# Patient Record
Sex: Female | Born: 1935 | Race: Black or African American | Hispanic: No | Marital: Single
Health system: Southern US, Community
[De-identification: ages and names within clinical notes are randomized; demographics above are authoritative.]

---

## 2008-06-17 ENCOUNTER — Ambulatory Visit: Payer: Self-pay | Admitting: Family Medicine

## 2008-06-27 ENCOUNTER — Ambulatory Visit: Payer: Self-pay | Admitting: Internal Medicine

## 2008-08-08 ENCOUNTER — Ambulatory Visit: Payer: Self-pay | Admitting: Internal Medicine

## 2008-08-27 ENCOUNTER — Ambulatory Visit: Payer: Self-pay | Admitting: Family Medicine

## 2008-11-12 ENCOUNTER — Ambulatory Visit: Payer: Self-pay

## 2009-07-09 IMAGING — CR DG ABDOMEN 1V
1 series · 1 of 1 positions shown · non-contrast
Comparison: none

REASON FOR EXAM: FECAL IMPACTION
COMMENTS:

[view not recorded]
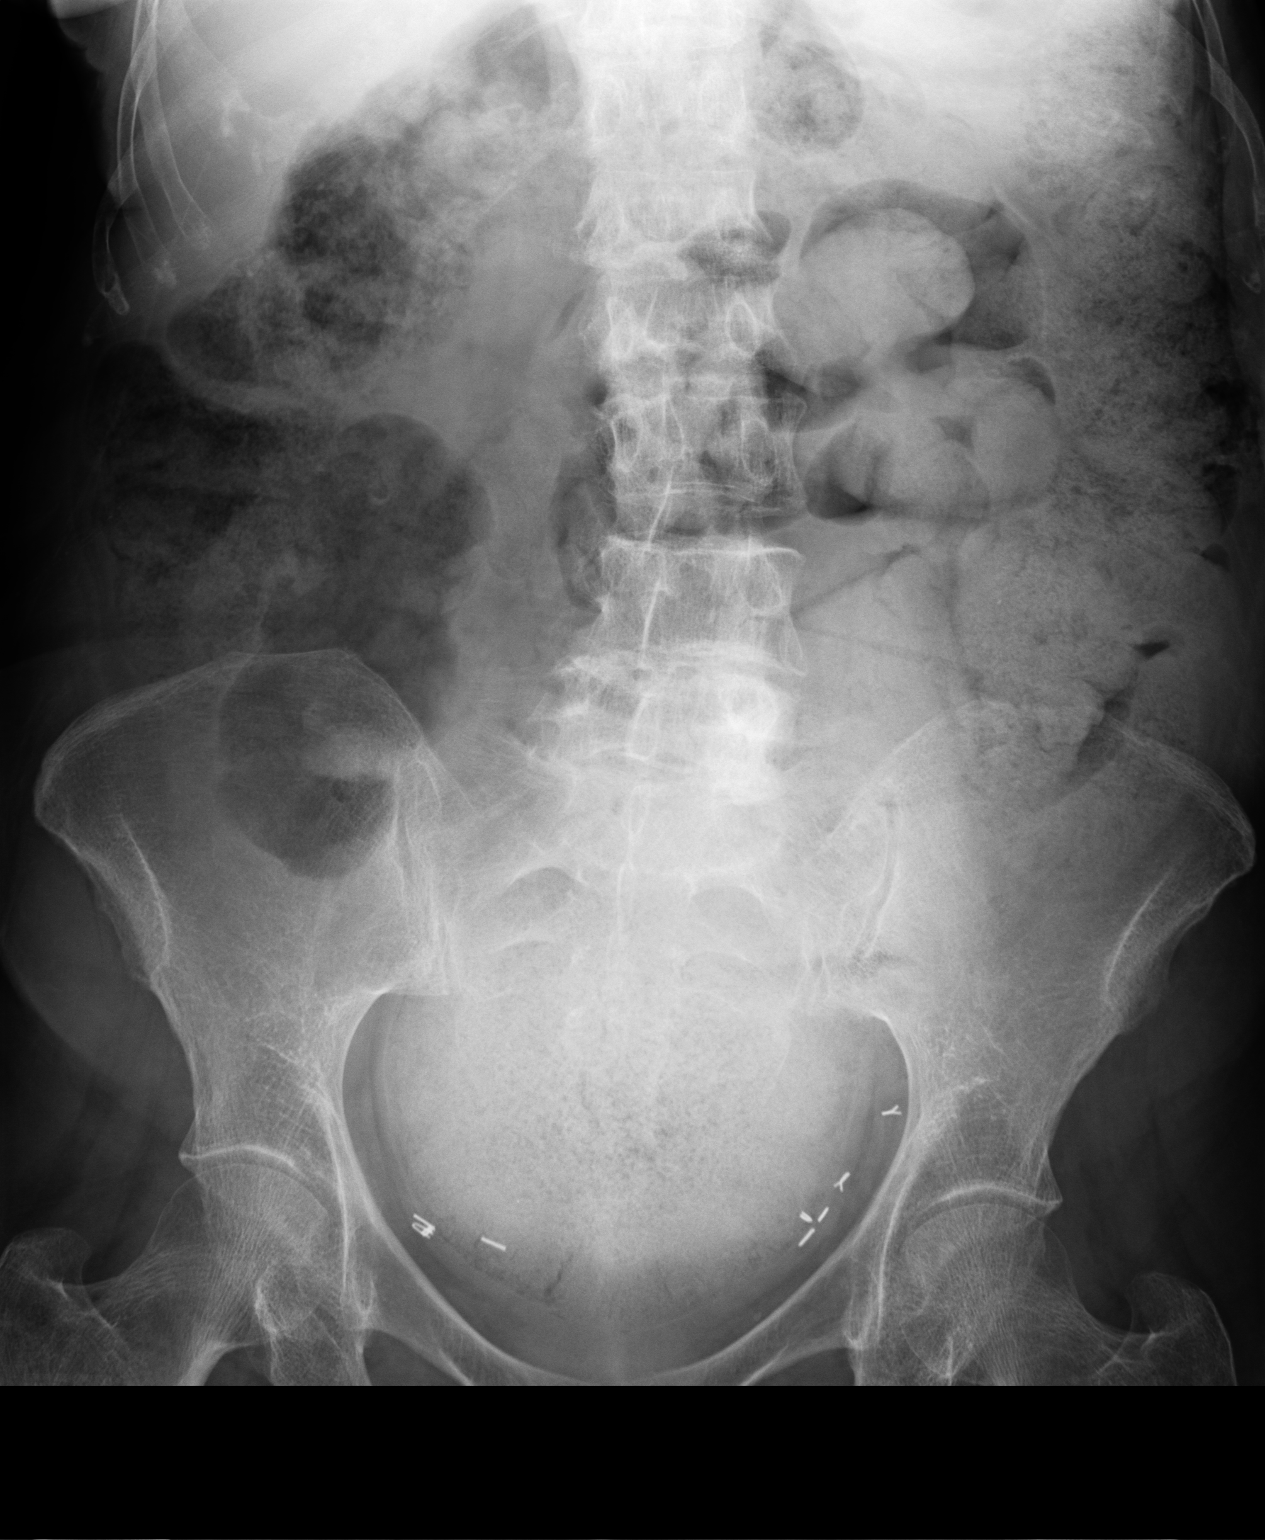

[1 of 1 positions shown; findings below may reference images not displayed]

PROCEDURE:     DXR - DXR KIDNEY URETER BLADDER  - August 08, 2008  [DATE]

RESULT:     There is a large amount of stool present throughout the colon
and rectum. The findings likely reflect fecal impaction. There are
degenerative changes of the lower lumbar spine. There are surgical clips in
the pelvis.
IMPRESSION: There is a fecal impaction present.

## 2009-07-28 IMAGING — CR DG CHEST 2V
1 series · 2 of 2 positions shown · non-contrast
Comparison: none

REASON FOR EXAM: +ppd
COMMENTS:

[Series 1: view not recorded · 0.17mm/px · 2 of 2 slices shown]
[im 1/2]
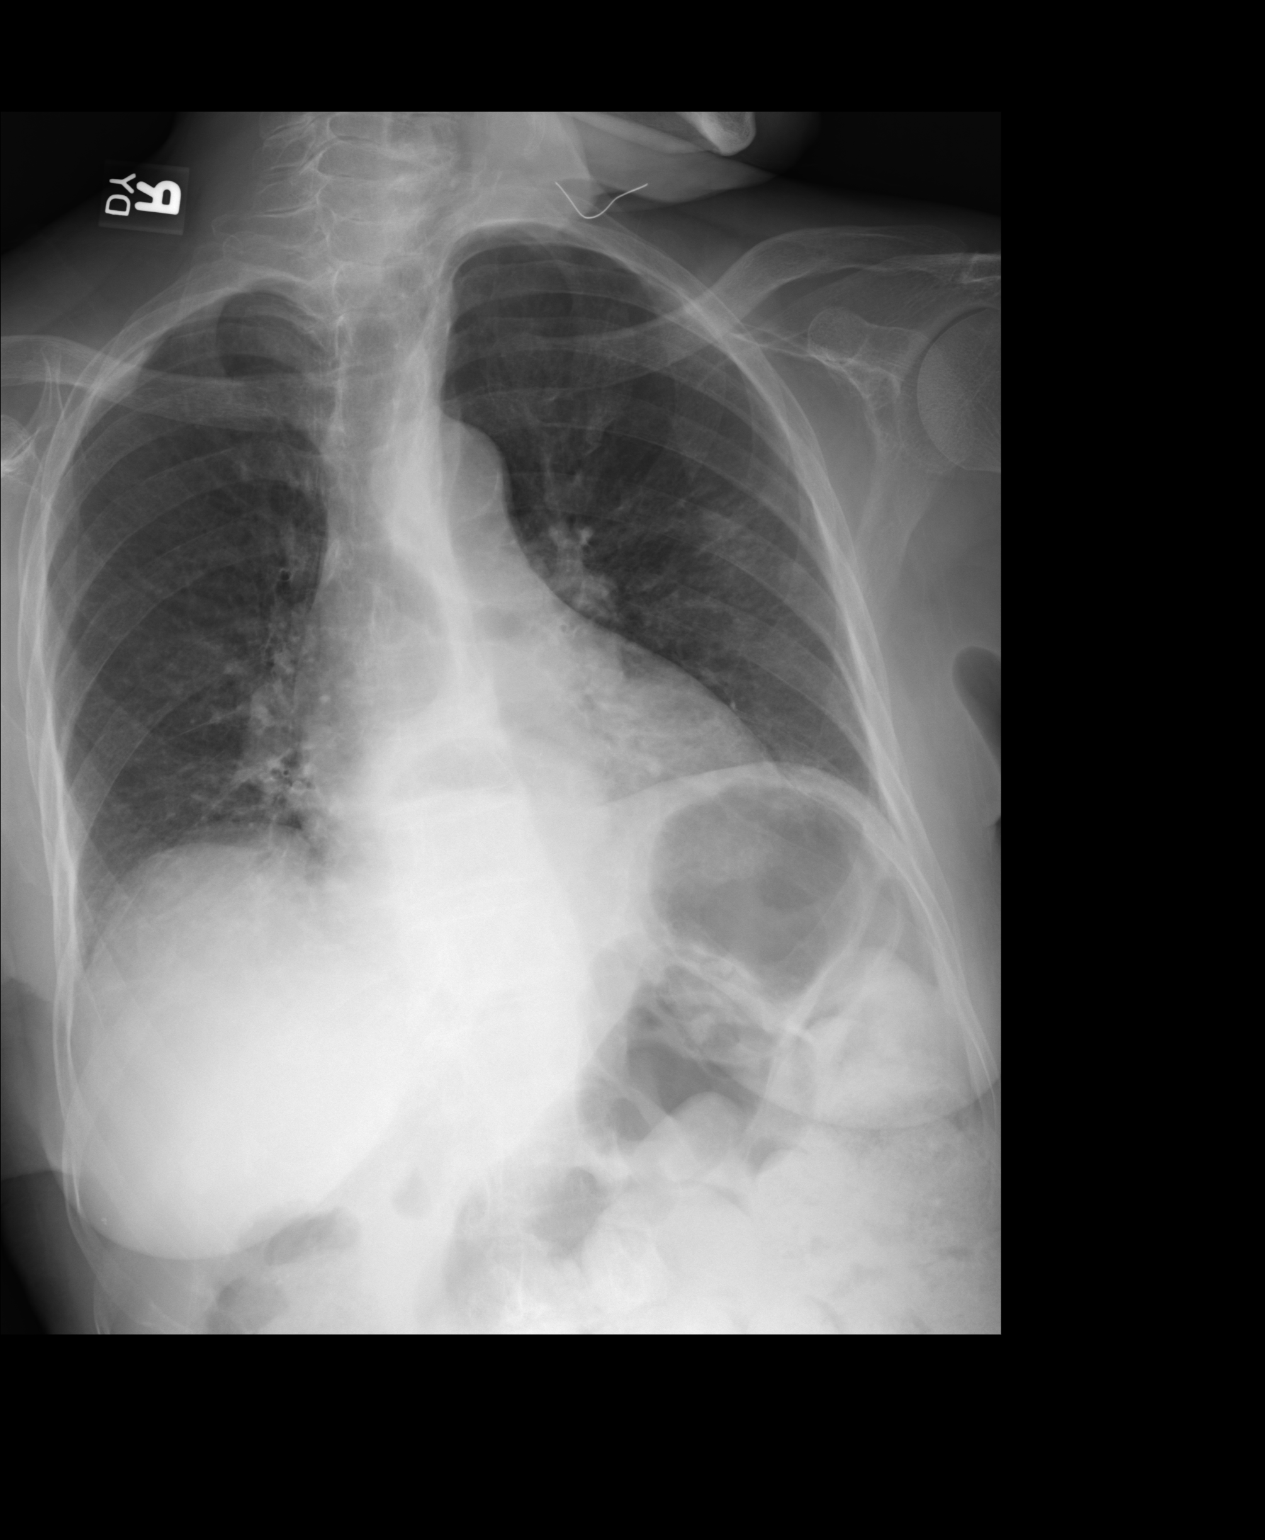
[im 2/2]
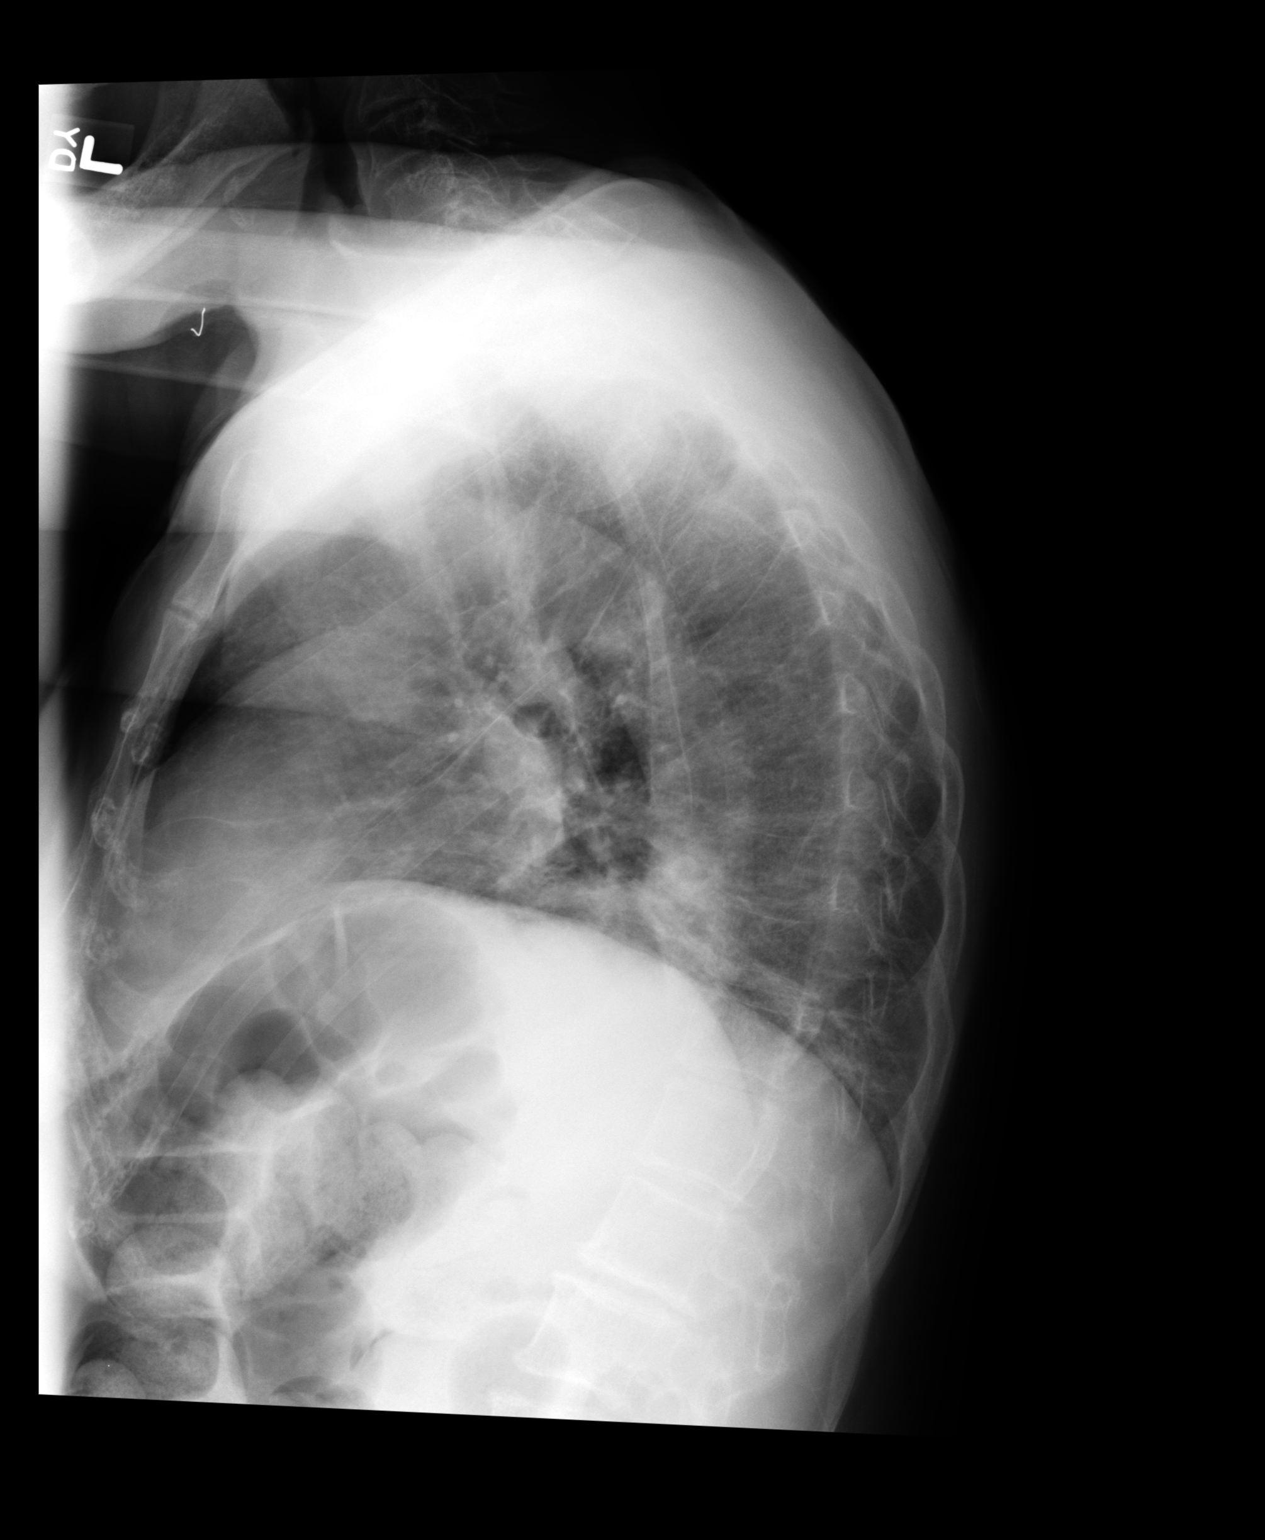

[2 of 2 positions shown; findings below may reference images not displayed]

PROCEDURE:     DXR - DXR CHEST PA (OR AP) AND LATERAL  - August 27, 2008  [DATE]

RESULT:     The lungs are mildly hypoinflated. There is no focal infiltrate.
There is a hiatal hernia present. The cardiac silhouette is mildly
prominent. The pulmonary interstitial markings are mildly increased. There
is no pleural effusion.
IMPRESSION: 1.I do not see evidence of pneumonia. The interstitial markings are mildly
prominent but this is not a new finding. There is a hiatal hernia present. I
do not see findings that would suggest acute or old tuberculous infection
currently. Follow-up films with deep inspiration would be of value if the
patient is having any acute symptoms.

## 2012-08-30 ENCOUNTER — Emergency Department: Payer: Self-pay | Admitting: Emergency Medicine

## 2012-08-30 LAB — COMPREHENSIVE METABOLIC PANEL
Alkaline Phosphatase: 160 U/L — ABNORMAL HIGH (ref 50–136)
Anion Gap: 7 (ref 7–16)
Bilirubin,Total: 0.4 mg/dL (ref 0.2–1.0)
Calcium, Total: 8.6 mg/dL (ref 8.5–10.1)
Chloride: 108 mmol/L — ABNORMAL HIGH (ref 98–107)
Co2: 26 mmol/L (ref 21–32)
Creatinine: 0.65 mg/dL (ref 0.60–1.30)
EGFR (Non-African Amer.): >60
Glucose: 130 mg/dL — ABNORMAL HIGH (ref 65–99)
Osmolality: 285 (ref 275–301)
SGPT (ALT): 16 U/L (ref 12–78)
Sodium: 141 mmol/L (ref 136–145)

## 2012-08-30 LAB — CBC
HCT: 37.2 % (ref 35.0–47.0)
HGB: 12.5 g/dL (ref 12.0–16.0)
MCH: 28.3 pg (ref 26.0–34.0)
RBC: 4.42 10*6/uL (ref 3.80–5.20)

## 2012-08-31 LAB — URINALYSIS, COMPLETE
Glucose,UR: NEGATIVE mg/dL (ref 0–75)
Ketone: NEGATIVE
Nitrite: NEGATIVE
Specific Gravity: 1.017 (ref 1.003–1.030)

## 2012-09-02 LAB — URINE CULTURE

## 2013-07-31 IMAGING — CT CT HEAD WITHOUT CONTRAST
2 series · 15 of 30 positions shown, 19 images · non-contrast
Comparison: none

REASON FOR EXAM: fall with head trauma
COMMENTS:

[Series 2: without · axial · non-contrast · 0.43mm/px · z∈[+462,+597]mm · 13 of 47 slices shown, 17 images]
[im 4/47  brain]
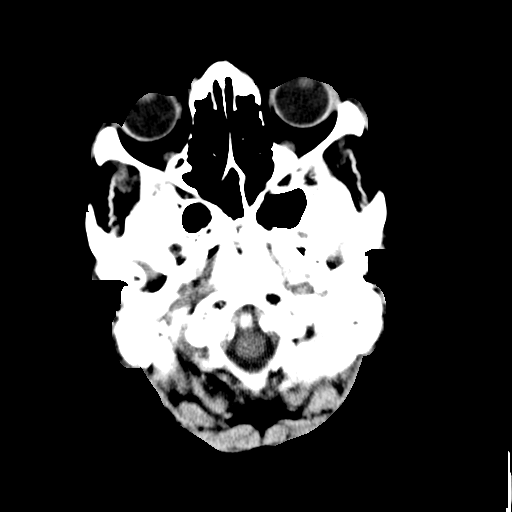
[im 4/47  bone]
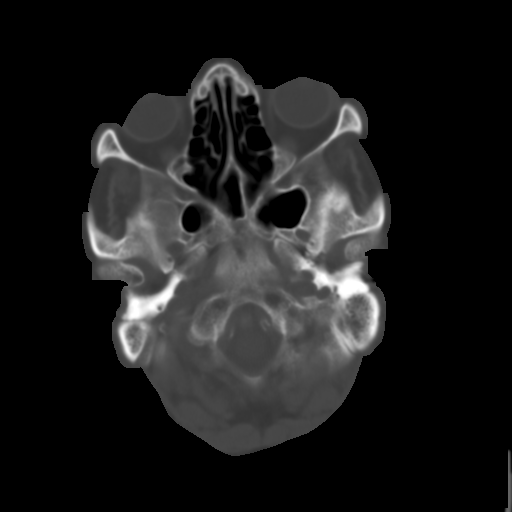
[im 7/47  brain]
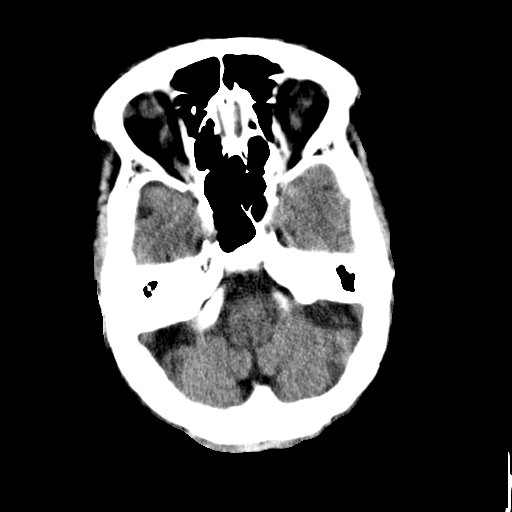
[im 10/47  brain]
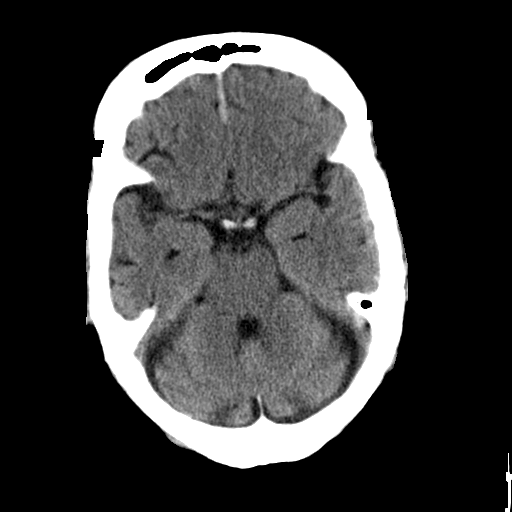
[im 14/47  brain]
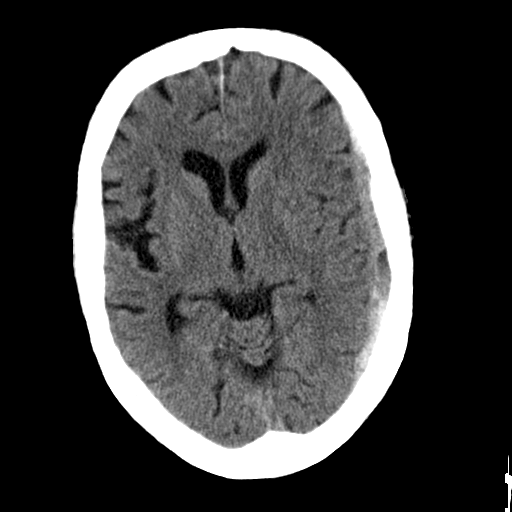
[im 17/47  brain]
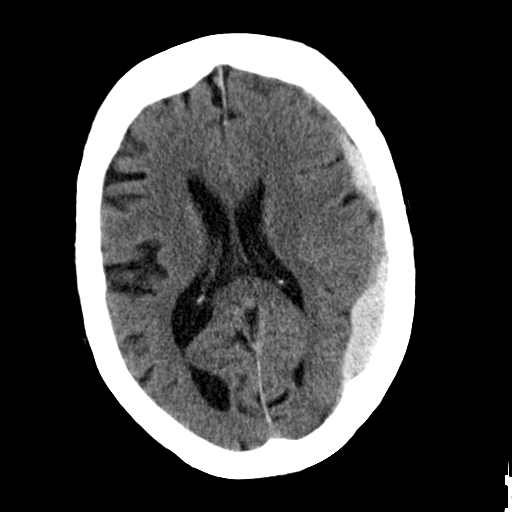
[im 17/47  bone]
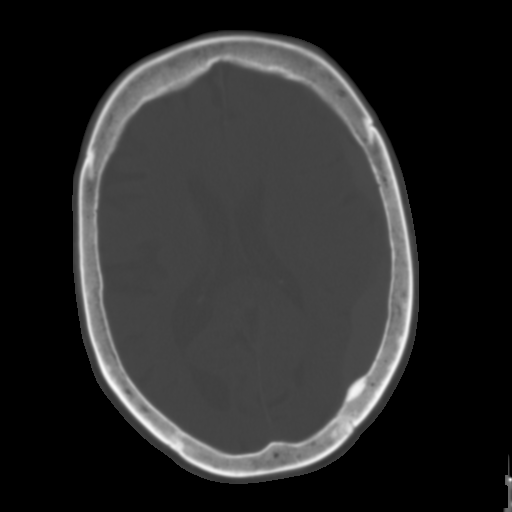
[im 20/47  brain]
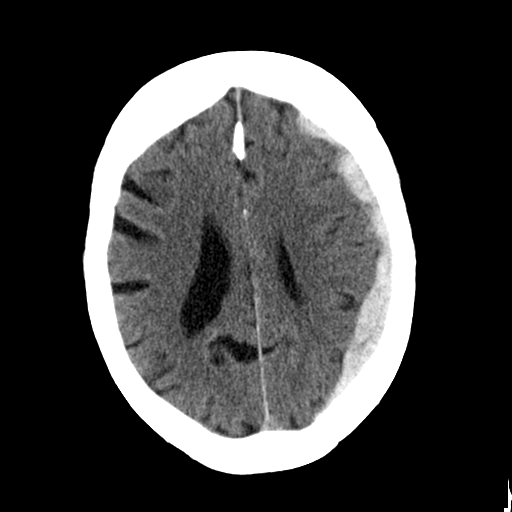
[im 24/47  brain]
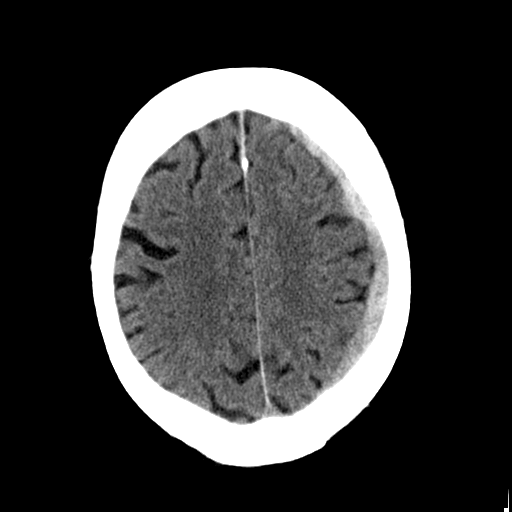
[im 27/47  brain]
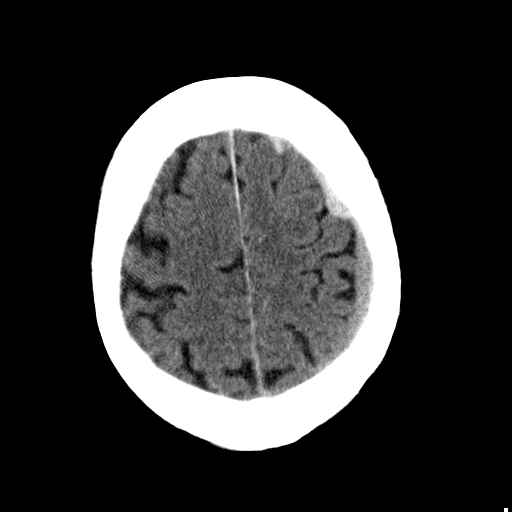
[im 30/47  brain]
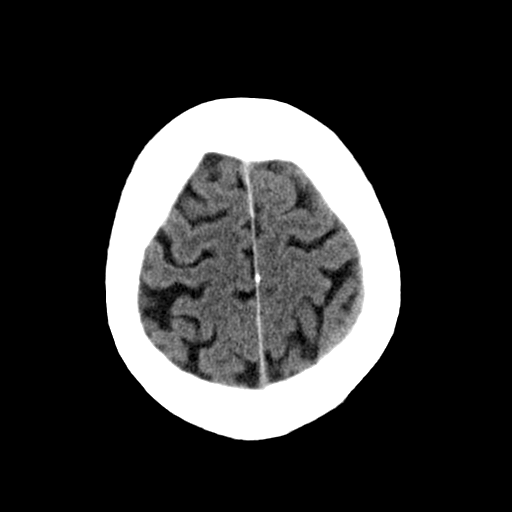
[im 30/47  bone]
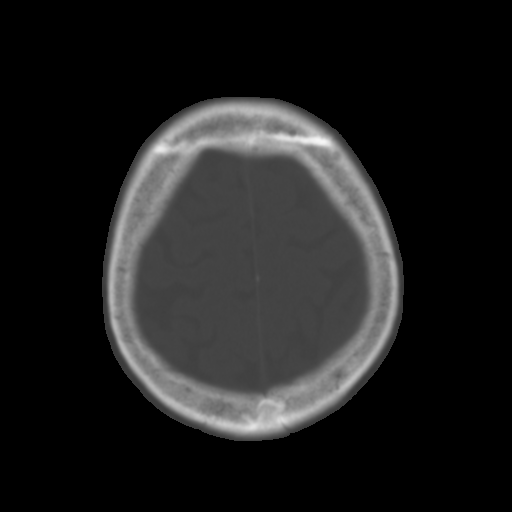
[im 33/47  brain]
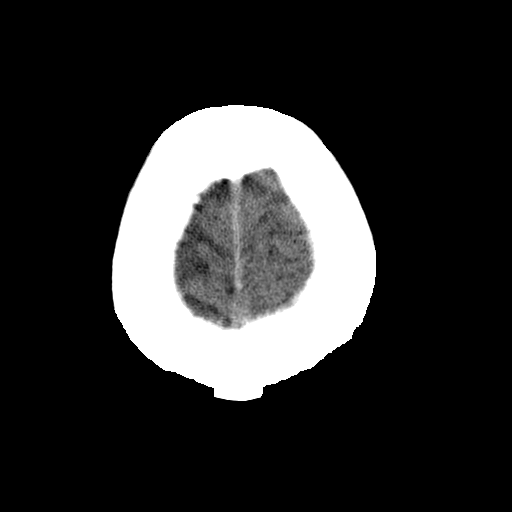
[im 37/47  brain]
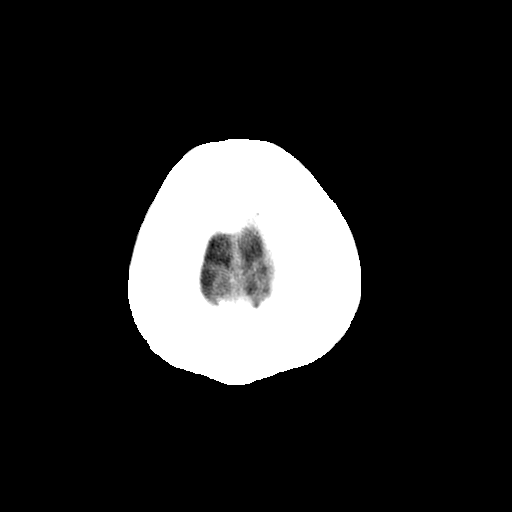
[im 40/47  brain]
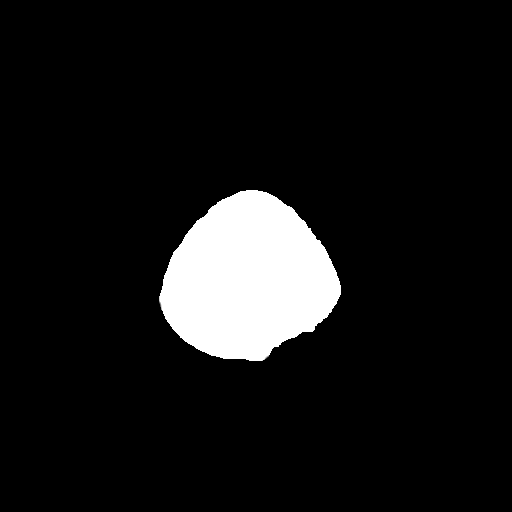
[im 43/47  brain]
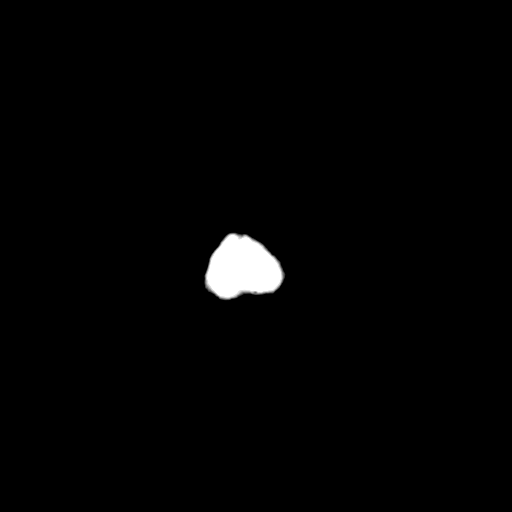
[im 43/47  bone]
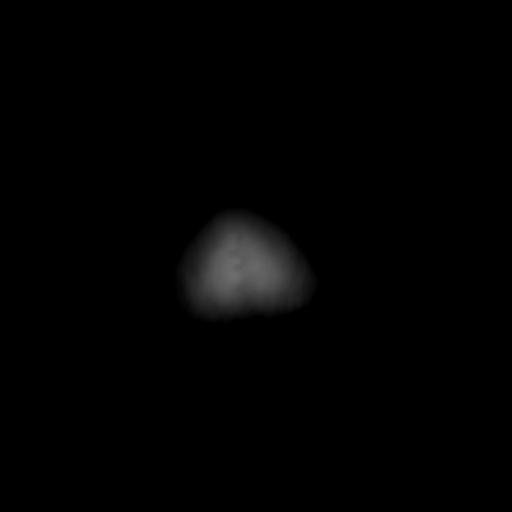

[Series 3: bone · axial · 0.43mm/px · z∈[+462,+492]mm · 2 of 47 slices shown]
[im 4/47  bone]
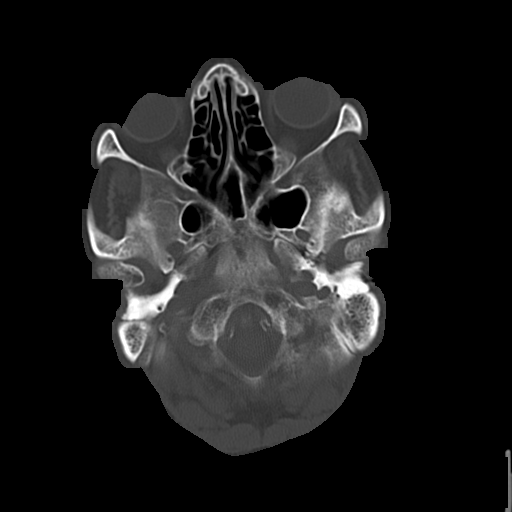
[im 10/47  bone]
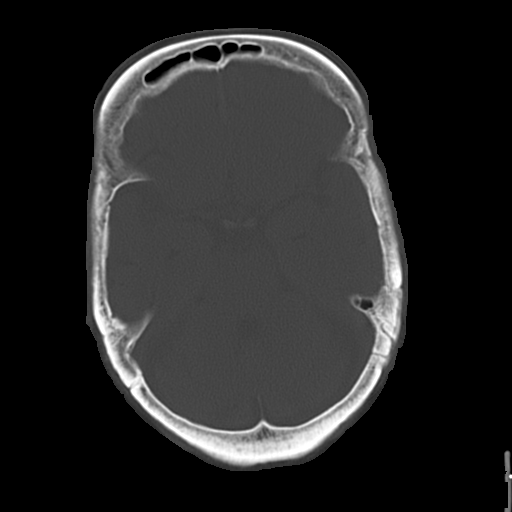

[15 of 30 positions shown; findings below may reference images not displayed]

PROCEDURE:     CT  - CT HEAD WITHOUT CONTRAST  - August 30, 2012 [DATE]

RESULT:     Axial noncontrast CT scanning was performed through the brain
with reconstructions at 5 mm intervals and slice thicknesses.

The ventricles correction there is a subdural hemorrhage over the left
cerebral convexity. This measures 1.5 cm in greatest thickness. A small
amount of blood along the anterior aspect of the falx cerebri is present.
There is minimal mass effect upon the left lateral ventricle and no more
than 1 to 2 mm of shift of the midline from left to right. No blood is seen
associated with the tentorium. There is no evidence of an acute parenchymal
nor intraventricular hemorrhage nor evidence of an evolving ischemic
infarction.

At bone window settings the observed portions of the paranasal sinuses and
mastoid air cells are clear. There is no evidence of an acute skull
fracture. A small amount of preseptal edema on the left is present. The
globe appears intact.
IMPRESSION: The findings are consistent with an acute subdural
hemorrhage overlying the left cerebral convexity. A small amount of blood
lies along the anterior aspect of the falx cerebri. No parenchymal or
intraventricular hemorrhage is demonstrated. No more than minimal mass
effect upon the left lateral ventricle is seen and shift of the midline and
by 1 to 2 mm is present from left the right.

A preliminary report was sent to the [HOSPITAL] the conclusion
of the study.

[REDACTED]

## 2014-06-26 ENCOUNTER — Ambulatory Visit
Admit: 2014-06-26 | Disposition: A | Discharge status: Home / Self Care | Payer: Self-pay | Attending: Nurse Practitioner | Admitting: Nurse Practitioner

## 2014-12-27 ENCOUNTER — Ambulatory Visit
Admit: 2014-12-27 | Disposition: A | Discharge status: Home / Self Care | Payer: Self-pay | Attending: Nurse Practitioner | Admitting: Nurse Practitioner

## 2019-01-29 ENCOUNTER — Non-Acute Institutional Stay: Payer: Medicare Other | Admitting: Nurse Practitioner

## 2019-01-29 ENCOUNTER — Encounter: Payer: Self-pay | Admitting: Nurse Practitioner

## 2019-01-29 VITALS — BP 112/68 | HR 68 | Temp 98.1°F | Resp 20 | Ht 63.0 in | Wt 101.1 lb

## 2019-01-29 DIAGNOSIS — R5381 Other malaise: Secondary | ICD-10-CM | POA: Insufficient documentation

## 2019-01-29 DIAGNOSIS — Z515 Encounter for palliative care: Secondary | ICD-10-CM

## 2019-01-29 DIAGNOSIS — R1310 Dysphagia, unspecified: Secondary | ICD-10-CM

## 2019-01-29 NOTE — Progress Notes (Signed)
[facility] Telephone: [phone] Fax: [phone]  PATIENT NAME: Dana Torres DOB: 1930-02-22 MRN: [id number]  PRIMARY CARE PROVIDER:   Dr Jamas Lav PROVIDER:  Dr [facility] RESPONSIBLE PARTY:   Reginae Woodhead Son [phone]  RECOMMENDATIONS and PLAN:  1. Palliative care encounter Z51.5; Palliative medicine team will continue to support patient, patient's family, and medical team. Visit consisted of counseling and education dealing with the complex and emotionally intense issues of symptom management and palliative care in the setting of serious and potentially life-threatening illness  Medical goals of care for DNR / do not intubate / do not hospitalize, no blood transfusion, no feeding tube, no surgical intervention. Wishes are for antibiotic therapy, IV fluids, lab testing, diagnostic testing.  2. Dysphagia R13.10; secondary to late onset CVA . Continue to monitor weights, appetite, aspiration precautions.   3. Debility R53.81 secondary to late onset CVA progressive, encourage passive rom; encourage to transfer to geri-chair.   ASSESSMENT:     I visited and observed Dana Torres. She did not make eye contact with verbal cues. No meaningful discussion due to severe cognitive impairment. She was cooperative with assessment. She does appear to be overall declining in the sending of chronic disease with dementia, dysphasia reflective of weight loss. Continue with current medical goals with DNR are in place. I have attempted to reach out to her son [name] for update for palliative care visit, unable to leave a message. Emotional support provided. I updated nursing staff no new changes to current goals are plan of care.  BMI 17.9 9 / 17 / 2019 weight 111.4 lbs 10 / 17 / 2019 weight 108.2 lbs 1 / 15 / 2020 weight 101.1 lbs  I spent 45 minutes providing this consultation,  from 12:00pm  To 12;45pm. More than 50% of the time in this consultation was  spent coordinating communication.   HISTORY OF PRESENT ILLNESS:  Dana Torres is a 85 y.o. year old female with multiple medical problems including Paranoid schizophrenia, psychosis, late onset CVA, vascular dementia, peripheral vascular disease,  anemia, epilepsy, extrapyramidal movements, subdural hematoma, dysphasia how much protein calorie malnutrition, cataracts, glaucoma. Continues to reside skilled [facility]-Term Care Nursing Facility at [facility]. She is a lift to the recliner where she is placed daily. She's total ADL assistance with incontinence about and bladder. She does require to be fed with appetite poor typically less than 25% of meals. She continues to lose weight. Last palliative care visit [place] her son was offered [employer] He was agreeable but then later was not admitted. She is severely cognitively impaired, nonverbal with flat affect. She is followed by Psychiatry at the facility with last data of service 1 / 13 / 2020 for paranoid schizophrenia, vascular dementia, idd, seizure disorder for what she is prescribed Dilantin. She continues on Risperdal for behaviors. No medication changes that this visit. She is followed by Ophthalmology with Podiatry also. No recent Falls, wounds, hospitalizations. Last primary provider visit 12/24 / 2019 for dysphasia remaining on soft diet with nectar thickened liquids. She was treated with Levaquin and clindamycin for symptoms of aspiration pneumonia on this visit. At present Dana. Crivelli is lying in bed, appears then, debilitated but comfortable. No visitors present. Palliative Care was asked to help address goals of care.   CODE STATUS: DNR  PPS: 30%  HOSPICE ELIGIBILITY/DIAGNOSIS: TBD  PAST MEDICAL HISTORY: History reviewed. No pertinent past medical history.  SOCIAL HX:  Social History  Tobacco Use  . Smoking status: Not on file  Substance Use Topics  . Alcohol use: Not on file    ALLERGIES: No Known Allergies    PERTINENT MEDICATIONS:  No outpatient encounter medications on file as of 01/29/2019.   No facility-administered encounter medications on file as of 01/29/2019.     PHYSICAL EXAM:   General: frail appearing, thin, severely debilitated cognitively/functionally female Cardiovascular: regular rate and rhythm Pulmonary: clear ant fields Abdomen: soft, nontender, + bowel sounds GU: no suprapubic tenderness Extremities: no edema, no joint deformities; +muscle wasting Skin: no rashes Neurological: Weakness but otherwise nonfocal/functional quadriplegic  Christin Prince RomeZ Gusler, NP

## 2019-03-07 ENCOUNTER — Other Ambulatory Visit: Payer: Self-pay

## 2019-03-07 ENCOUNTER — Non-Acute Institutional Stay: Payer: Medicare Other | Admitting: Nurse Practitioner

## 2019-03-07 ENCOUNTER — Encounter: Payer: Self-pay | Admitting: Nurse Practitioner

## 2019-03-07 VITALS — BP 90/58 | HR 92 | Temp 97.5°F | Resp 20 | Wt 100.0 lb

## 2019-03-07 DIAGNOSIS — Z515 Encounter for palliative care: Secondary | ICD-10-CM

## 2019-03-07 DIAGNOSIS — R5381 Other malaise: Secondary | ICD-10-CM | POA: Insufficient documentation

## 2019-03-07 DIAGNOSIS — R1310 Dysphagia, unspecified: Secondary | ICD-10-CM

## 2019-03-07 NOTE — Progress Notes (Signed)
Therapist, nutritional Palliative Care Consult Note Telephone: [phone]  Fax: [phone]  PATIENT NAME: Dana Torres DOB: 06/26/1930 MRN: [id number]  PRIMARY CARE PROVIDER:   Dr Jamas Lav PROVIDER: Dr [facility] RESPONSIBLE PARTY:   Evony Stoutamire Son [phone]  RECOMMENDATIONS and PLAN:  1. Palliative care encounter Z51.5; Palliative medicine team will continue to support patient, patient's family, and medical team. Visit consisted of counseling and education dealing with the complex and emotionally intense issues of symptom management and palliative care in the setting of serious and potentially life-threatening illness  Medical goals of care for DNR / do not intubate / do not hospitalize, no blood transfusion, no feeding tube, no surgical intervention. Wishes are for antibiotic therapy, IV fluids, lab testing, diagnostic testing.  2. Dysphagia R13.10; secondary to late onset CVA . Continue to monitor weights, appetite, aspiration precautions.   3. Debility R53.81 secondary to late onset CVA progressive, encourage passive rom; encourage to transfer to geri-chair.   ASSESSMENT:     I visited and observed Dana Torres. She did not make eye contact with verbal cues. She was cooperative with assessment. No verbal sounds. She is a DNR with medical goals focusing on Comfort Care. Will continue to Monitor and follow with palliative care, continue to monitor for decline. Should she continue to lose weight will revisit hospice if 10% weight loss. I updated nursing staff. Emotional support provided.  BMI 17.9 9 / 17 / 2019 weight 111.4 lbs 10 / 17 / 2019 weight 108.2 lbs 1 / 15 / 2020 weight 101.1 lbs 02/09/2019 weight 100.0 lbs  I spent 45 minutes providing this consultation,  from 2:00pm to 2:45pm. More than 50% of the time in this consultation was spent coordinating communication.   HISTORY OF PRESENT ILLNESS:  Dana Torres is a 85  y.o. year old female with multiple medical problems including Paranoid schizophrenia, psychosis, late onset CVA, vascular dementia, peripheral vascular disease,anemia, epilepsy, extrapyramidal movements, subdural hematoma, dysphasia how much protein calorie malnutrition, cataracts, glaucoma. Dana Torres continues to reside at skilled Long-Term Care Nursing Facility. She remains functional quadriplegic, total lift to the geri chair. She is total ADL dependence and incontinent bowel and bladder. Appetite remains fair with relatively stable way into pound weight loss over the last  8 weeks. BMI 17.7. She remains severely functional and cognitively impaired. Last primary provider visit 3 / 2 / 2024 monthly visit with acute UTI require a antibiotic therapy. In addition she was having some difficulty with shortness of breath and nebulizer treatments chest x-ray which was negative. Staff reports her breathing did improve with nebulizer. At present she is lying in bed in a fetal position. She appears then, chronically ill and debilitated but comfortable. No visitors present. Palliative Care was asked to help address goals of care.   CODE STATUS: DNR  PPS: 30% HOSPICE ELIGIBILITY/DIAGNOSIS: TBD  PAST MEDICAL HISTORY: History reviewed. No pertinent past medical history.  SOCIAL HX:  Social History   Tobacco Use  . Smoking status: Not on file  Substance Use Topics  . Alcohol use: Not on file    ALLERGIES: No Known Allergies   PERTINENT MEDICATIONS:  No outpatient encounter medications on file as of 03/07/2019.   No facility-administered encounter medications on file as of 03/07/2019.     PHYSICAL EXAM:   General: NAD, frail appearing, thin elderly female Cardiovascular: regular rate and rhythm Pulmonary: clear ant fields Abdomen: soft, nontender, + bowel sounds GU: no  suprapubic tenderness Extremities: no edema, no joint deformities/muscle wasting Skin: no rashes Neurological: Weakness but  otherwise nonfocal/functional quadriplegic   Prince Rome, NP

## 2019-04-05 ENCOUNTER — Encounter: Payer: Self-pay | Admitting: Nurse Practitioner

## 2019-04-05 ENCOUNTER — Other Ambulatory Visit: Payer: Self-pay

## 2019-04-05 ENCOUNTER — Non-Acute Institutional Stay: Payer: Medicare Other | Admitting: Nurse Practitioner

## 2019-04-05 VITALS — BP 110/68 | HR 86 | Temp 98.2°F | Resp 18 | Wt 93.1 lb

## 2019-04-05 DIAGNOSIS — Z515 Encounter for palliative care: Secondary | ICD-10-CM

## 2019-04-05 DIAGNOSIS — R5381 Other malaise: Secondary | ICD-10-CM

## 2019-04-05 DIAGNOSIS — R1310 Dysphagia, unspecified: Secondary | ICD-10-CM

## 2019-04-05 DIAGNOSIS — R630 Anorexia: Secondary | ICD-10-CM

## 2019-04-05 NOTE — Progress Notes (Signed)
Therapist, nutritional Palliative Care Consult Note Telephone: [phone]  Fax: [phone]  PATIENT NAME: Dana Torres DOB: 02-01-1930 MRN: [id number]  PRIMARY CARE PROVIDER:   Dr Jamas Lav PROVIDER:  Dr [facility] RESPONSIBLE PARTY:    Dana Torres Son [phone]  RECOMMENDATIONS and PLAN:  1.Palliative care encounter Z51.5; Palliative medicine team will continue to support patient, patient's family, and medical team. Visit consisted of counseling and education dealing with the complex and emotionally intense issues of symptom management and palliative care in the setting of serious and potentially life-threatening illness  Medical goals of care for DNR / do not intubate / do not hospitalize, no blood transfusion, no feeding tube, no surgical intervention. Wishes are for antibiotic therapy, IV fluids, lab testing, diagnostic testing.  2.Dysphagia R13.10; secondary tolate onset CVA. Continue to monitor weights, appetite, aspiration precautions.   3.Debility R53.81 secondary to late onset CVA progressive, encourage passive rom; encourage to transfer to geri-chair.  4. Anorexia R63.0 but appetite remaining declined. Continue to encourage supplements and comfort feedings ASSESSMENT:     I visited and observed Dana Torres. She did not make eye contact with verbal cues though did have her eyes open. She was quite furtive with assessment. No verbal responses. She does appear to be over all slowly declining in the setting of chronic disease. I have attempted to contact her Son Dana Torres. Hospice was recommended but Dana Torres did not follow through previously and opted to continue with palliative care. The new changes to current plan of care. Continue with medical goals to focus on Comfort. I have updated nursing staff.  BMI 16.5 2 / 14 / 2020 weight 100.0 lbs 3 / 15 / 2020 weight 100.1 lbs 4 / 5 / 2020 weight 93.1 lbs  I spent 45 minutes  providing this consultation,  from 10:15 am to 11:00am. More than 50% of the time in this consultation was spent coordinating communication.   HISTORY OF PRESENT ILLNESS:  Dana Torres is a 85 y.o. year old female with multiple medical problems including Paranoid schizophrenia, psychosis, late onset CVA, vascular dementia, peripheral vascular disease,anemia, epilepsy, extrapyramidal movements, subdural hematoma, dysphasia how much protein calorie malnutrition, cataracts, glaucoma. Dana Torres continues to reside at skilled Long-Term Care Nursing Facility. She is a lift to the [employer], total ADL dependence with incontinence bowel and bladder. She does required to be fed. Appetite has continued to be poor. Last primary provider visit 3 / 2 / 2020 for UTI and difficulty breathing during the night. Chest x-ray was at negative. She was treated with Rocephin antibiotic therapy. Tylenol for fever and pain. Psychiatry does follow at the facility with last date of service 3/5 /2000 for paranoid schizophrenia, vascular dementia. She is prescribed Risperdal for behaviors. She does take Dilantin for seizure disorder which she has not had any recent seizure activity. No changes in medications at this visit. She is severely cognitively impaired. At present Dana Torres is lying in bed. She appears comfortable though thin and debilitated. No visitors present. Palliative Care was asked to help address goals of care.   CODE STATUS: DNR PPS: 30% HOSPICE ELIGIBILITY/DIAGNOSIS: TBD  PAST MEDICAL HISTORY: History reviewed. No pertinent past medical history.  SOCIAL HX:  Social History   Tobacco Use  . Smoking status: Not on file  Substance Use Topics  . Alcohol use: Not on file    ALLERGIES: No Known Allergies   PERTINENT MEDICATIONS:  No outpatient encounter medications on  file as of 83/08/2019.   No facility-administered encounter medications on file as of 04/05/2019.     PHYSICAL EXAM:   General: NAD, frail  appearing, thin, elderly, cognitively impaired female Cardiovascular: regular rate and rhythm Pulmonary: clear ant fields Abdomen: soft, nontender, + bowel sounds GU: no suprapubic tenderness Extremities: no edema, no joint deformities/+muscle wasting Skin: no rashes Neurological: Weakness but otherwise nonfocal/functional quadriplegic  Anden Bartolo Prince RomeZ Patrena Santalucia, NP

## 2019-05-28 DEATH — deceased
